# Patient Record
Sex: Male | Born: 1950 | Race: White | Hispanic: No | Marital: Single
Health system: Midwestern US, Academic
[De-identification: ages and names within clinical notes are randomized; demographics above are authoritative.]

---

## 2006-03-29 NOTE — Unmapped (Signed)
Signed by   LinkLogic on 03/30/2006 at 00:27:42  Patient: Bobby Gilbert  Note: All result statuses are Final unless otherwise noted.    Tests: (1)  (MR)    Order Note:                                    THE [facility]     PATIENT NAME:   Bobby Gilbert, Bobby Gilbert                MR #:  [id number]  DATE OF BIRTH:  05/08/1951                         ACCOUNT #:  [id number]  ED PHYSICIAN:   Sidonie Dickens, M.D.           ROOM #:  ED  PRIMARY:        Eulogio Bear, M.D.                NURSING UNIT:  FED  REFERRING:                                         FC:  C  DICTATED BY:    Sidonie Dickens, M.D.           ADMIT DATE:  03/30/2006  VISIT DATE:     03/29/2006                         DISCHARGE DATE:                           EMERGENCY DEPARTMENT ADMISSION NOTE     DATE OF BIRTH:  06/07/2051     CHIEF COMPLAINT:  Chest pain.     HISTORY OF PRESENT ILLNESS:  This is a 55 year old male who has had chest  pain that started about 8:00 p.m. tonight.  He describes it as a pressure  type sensation, it is in the upper part of his abdomen and lower part of his  chest.  He says that after he noticed it, he started sweating and felt very  lightheaded.  His wife tells me he nearly passed out at least three different  times.  He was just sitting down when he noticed his symptoms.  He tried some  Tums and it did not relieve it.  As time progressed, he started experiencing  the pain up in his shoulder and in his left arm and in his back.  He has had  similar symptoms in the past, but it has been years ago since the last  episode.  He has had a stress test but his last one was three years ago.  He  did have some shortness of breath with this.  He has been nauseous, but has  not thrown up.  Nothing seems to make the pain any better or worse.     Nursing notes were reviewed.     REVIEW OF SYSTEMS:  Denies recent fever, denies cough, has had difficulty  breathing with this discomfort.  No leg swelling.  No lower abdominal  pain.  Remainder of pertinent review of systems reviewed and negative.     PAST MEDICAL HISTORY:  1. Acid reflux.     PAST SURGICAL HISTORY:     1. Internal hemorrhoids.     CURRENT MEDICATIONS:     1. Prevacid.     ALLERGIES:  No known drug allergies.     SOCIAL HISTORY:  He does not smoke.  He does occasionally uses alcohol and  actually had two alcoholic beverages this evening prior to experiencing pain.     FAMILY HISTORY:  Positive for heart disease and believes his mother had her  first heart attack at the age of 36.  She died in her mid 19s of an MI.  Paternal history is not known.     IMMUNIZATIONS:  Not relevant.     PHYSICAL EXAMINATION:     VITAL SIGNS:  Temperature 95.9, pulse 98, respiratory 14, blood pressure  131/73, pulse oximetry 100% on room air.  GENERAL:  This is a 55 year old male who appears uncomfortable, sitting up on  the stretcher.  He is awake, alert and his speech is clear.  HEENT:  Head is  atraumatic.  Conjunctiva is not injected.  Sclerae is  nonicteric.  Mouth with moist mucous membranes.  NECK:  Supple.  No obvious JVD.  LUNGS:  Sounds clear to auscultation with symmetric breath sounds.  HEART:  Regular.  I do not appreciate any murmurs or rubs.  ABDOMEN:  Soft, mildly tender in the epigastric area.  No rebounding or  guarding appreciated.  No pulsatile masses or bruits present.  GENITAL AND RECTAL:  Exams deferred.  EXTREMITIES:  Warm.  There is no peripheral edema, no venous cords or calf  tenderness.  SKIN:  Warm and dry at this point in time.  PSYCHIATRIC:  Affect is appropriate.     DIAGNOSTIC RESULTS:  EKG demonstrates sinus rhythm with a rate of 90 beats  per minute without ectopy.  Conduction intervals normal.  Axis is normal.  There is nonspecific ST-T wave abnormality present.  RS prime pattern in lead  III.  No EKGs available for comparison.     Chest x-ray demonstrates clear lung fields, normal cardiac border, and  mediastinum appears normal.  X-ray was interpreted by  me in the absence of a  radiologist, consisting of one view.     Cardiac markers are negative.  CBC normal.  Electrolytes normal.  Coags  normal.  White cell count slightly elevated at 13.9, n eutrophils 82%.  Lipase and liver enzymes currently pending.     ADDITIONAL DATA:  None.     EMERGENCY DEPARTMENT COURSE:  He has received aspirin and sublingual  nitroglycerin.  The nitroglycerin actually made his chest hurt worse.  He was  offered a dose of morphine, which he refused as he was nauseous and vomited  once and then started feeling better.  He did receive a dose of Phenergan and  has been hemodynamically stable thus far.     PROCEDURE:  None.     CRITICAL CARE:  None.     CONSULTATIONS:  I will speak with Dr. Glean Hess.     MEDICAL DECISION MAKING:  This is a 55 year old male who presents with chest  and upper abdominal pain.  His workup thus far is nondiagnostic.  Included in  the differential was the possibility of acute coronary syndrome,  pancreatitis, cholecystitis, pulmonary embolism, aortic dissection,  pneumothorax, esophageal spasm, amongst other diagnostic considerations.  The  patient did have a heavy meal just prior to the symptoms starting.  It is  certainly possible that her  symptoms have a gastrointestinal source, but  acute coronary syndrome was also in the differential.  I think he needs to be  admitted for further evaluation and treatment.  I will discuss the case with  Dr. Glean Hess and recommend hospital admission.     IMPRESSION:     1.  Chest pain.     PLAN:     1.  Admission to monitored floor.                                                          ________________________________________  CAM/lsp                               ____  D:  03/29/2006 23:45                  Sidonie Dickens, M.D.  T:  03/30/2006 00:21  Job #:  [id number]                              EMERGENCY DEPARTMENT ADMISSION NOTE                                        COPY                   PAGE    1 of 1    Note: An exclamation  mark (!) indicates a result that was not dispersed into   the flowsheet.  Document Creation Date: 03/30/2006 12:27 AM  _______________________________________________________________________    (1) Order result status: Final  Collection or observation date-time: 03/29/2006 00:00  Requested date-time:   Receipt date-time:   Reported date-time:   Referring Physician: Eulogio Bear  Ordering Physician:  Reviewed In Hospital [facility])  Specimen Source:   Source: DBS  Filler Order Number: [id number]  Lab site:

## 2006-03-30 NOTE — Unmapped (Signed)
Signed by   LinkLogic on 03/31/2006 at 00:57:42  Patient: Bobby Gilbert  Note: All result statuses are Final unless otherwise noted.    Tests: (1)  (MR)    Order Note:                                    THE [facility]     PATIENT NAME:   Bobby, Gilbert                MR #:  [id number]  DATE OF BIRTH:  [birth year]                         ACCOUNT #:  [id number]  ADMITTING:      Fritzi Mandes, M.D.               ROOM #:  [phone]  ATTENDING:      Fritzi Mandes, M.D.               NURSING UNIT:  [facility]  PRIMARY:        Eulogio Bear, M.D.                Elaina HoopsSalena Saner  REFERRING:                                         ADMIT DATE:  03/30/2006  DICTATED BY:    Deforest Hoyles, M.D.              DISCHARGE DATE:                                  HISTORY & PHYSICAL     CHIEF COMPLAINT:  Chest pain.     HISTORY OF PRESENT ILLNESS:  The patient is a 55 year old gentleman who went  to have dinner at a new restaurant.  He had a couple of drinks and also had  steak.  He came back to his friend's house, was sitting there for an hour,  and then started developing some nausea and bloating sensation and heaviness.  He also felt lightheaded and was almost ready to pass out.  As per the ER  report, the wife mentioned that he did pass out.  He also broke out into a  sweat and then later developed some chest heaviness and pain radiating to the  left side.  He was brought to the emergency room for further evaluation.  He  vomited in the emergency room, which did help his symptoms.     PAST MEDICAL HISTORY:     1.  Gastroesophageal reflux disease.     PAST SURGICAL HISTORY:     1.  Hemorrhoid surgery.     MEDICATIONS:     1.  Prevacid 30 mg daily.     ALLERGIES:  No known drug allergies.     FAMILY HISTORY:  Positive for heart disease in his mother who is deceased.     SOCIAL HISTORY:  The patient does not smoke cigarettes.  He does drink  alcohol occasionally.  He denies any street drug use to me.     REVIEW OF SYSTEMS:  The patient  denies any fever, chills.  He denies any  headache.  He denies any sore throat.  He denies any diarrhea or  constipation.  He denies any abdominal pain.  He denies any swelling in his  feet or legs.  Rest of review of systems is negative.     PHYSICAL EXAMINATION:     VITAL SIGNS:  Temperature is afebrile, pulse 86, blood pressure 108/70.  GENERAL:  The patient is conscious, awake and alert.  He is oriented to time  and place.  Affect is appropriate.  He is not in any respiratory distress.  Normal male physical exam for the hospitalist with following exception.  RECTAL:  Not done.  GU:  Not done.     LABORATORY DATA:  CBC is within normal limits.  Chemistry panel is also  within normal limits.  CPK and troponin are negative.  LFTs are within normal  limits.  Troponin is less than 0.01 and INR is 1.1.     CHEST X-RAY:  Did not show any acute abnormality.     EKG:  Shows sinus rhythm with heart rate of 71.  No acute ST-T wave changes.     ASSESSMENT:     1.  The patient is a 55 year old gentleman with gastroesophageal reflux  disease.  He presents with chest pain, nausea, vomiting and passing out  spells.  He was also diaphoretic.  His symptoms did start after a large meal,  which raises concern for hiatal hernia and/or gallbladder problems.  His LFTs  are within normal limits.     PLAN:     1.  The patient to be admitted to telemetry bed for 23-hour observation.  2.  Chest pain pathway.  3.  Continue Prevacid.  4.  Consider gallbladder ultrasound as an outpatient if symptoms recur.                                                                ________________________________________  MS/am                                 ____  D:  03/30/2006 10:47                  Deforest Hoyles, M.D.  T:  03/30/2006 14:08  Job #:  [id number]  c:   Azell Der, M.D.                                  HISTORY & PHYSICAL                                        COPY                   PAGE    1 of 1    Note: An exclamation mark (!) indicates  a result that was not dispersed into   the flowsheet.  Document Creation Date: 03/31/2006 12:57 AM  _______________________________________________________________________    (1) Order result status: Final  Collection or observation date-time: 03/30/2006 00:00  Requested date-time:   Receipt date-time:   Reported date-time:  Referring Physician: Eulogio Bear  Ordering Physician:  Reviewed In Hospital [facility])  Specimen Source:   Source: DBS  Filler Order Number: [id number]  Lab site:

## 2006-03-31 NOTE — Unmapped (Signed)
Signed by   LinkLogic on 03/31/2006 at 15:33:11  Patient: Bobby Gilbert  Note: All result statuses are Final unless otherwise noted.    Tests: (1)  (MR)    Order Note:                                    THE [facility]     PATIENT NAME:   Bobby Gilbert, Bobby Gilbert                MR #:  [id number]  DATE OF BIRTH:  1951/03/15                         ACCOUNT #:  [id number]  ADMITTING:      Fritzi Mandes, M.D.               ROOM #:  [phone]  ATTENDING:      Fritzi Mandes, M.D.               NURSING UNIT:  F4W  SERVICE:        Internal Medicine                  FC:  C  PRIMARY:        Eulogio Bear, M.D.                ADMIT DATE:  03/30/2006  REFERRING:                                         DISCHARGE DATE:  03/31/2006  DICTATED BY:    Deforest Hoyles, M.D.                                 STAT DISCHARGE SUMMARY     DISCHARGE DIAGNOSES:     1. Chest pain.  2. Gastroesophageal reflux disease.  3. Syncope.     HISTORY OF PRESENT ILLNESS:  Bobby Gilbert is a 55 year old gentleman who is brought  to the emergency room because of chest pain and not feeling well.     HOSPITAL COURSE:  The patient was admitted to telemetry bed.  He was ruled  out for MI by serial cardiac enzymes and EKGs.  He underwent dual-isotope  myocardial perfusion imaging study.  This did not show any reversible  ischemia.  Left ventricular ejection fraction was approximately 72%.  In view  of his persistent nausea and some abdominal discomfort, a CT scan of the  abdomen was carried out with contrast and this did not show any acute  pathology.  It did show simple cysts in the liver.     CONDITION ON DISCHARGE:  The patient is stable hemodynamically.  He is  tolerating his diet well.  He wants to go home.     DISCHARGE INSTRUCTIONS:     1. The patient is to be discharged home.  2. Follow up with Dr. Estill Bamberg in three to five days.  3. If the patient still has symptoms of nausea and vomiting, he may need     further evaluation with a HIDA scan or an EGD.  4.  Prevacid 30 mg daily.  ________________________________________  MS/kc                                 ____  D:  03/31/2006 15:12                  Deforest Hoyles, M.D.  T:  03/31/2006 15:27  Job #:  [id number]  c:   Azell Der, M.D.                                 STAT DISCHARGE SUMMARY                                        COPY                   PAGE    1 of 1    Note: An exclamation mark (!) indicates a result that was not dispersed into   the flowsheet.  Document Creation Date: 03/31/2006 3:33 PM  _______________________________________________________________________    (1) Order result status: Final  Collection or observation date-time: 03/31/2006 00:00  Requested date-time:   Receipt date-time:   Reported date-time:   Referring Physician: Eulogio Bear  Ordering Physician:  Reviewed In Hospital [facility])  Specimen Source:   Source: DBS  Filler Order Number: [id number]  Lab site:

## 2006-11-10 NOTE — Unmapped (Signed)
Signed by   LinkLogic on 11/14/2006 at 07:51:42  Patient: Bobby Gilbert  Note: All result statuses are Final unless otherwise noted.    Tests: (1) DIAG-CHEST PA & LATERAL [phone])    Order NotePricilla Handler Order Number: [id number]    Non-EMR Ordering Provider: Sabino Niemann      Order Note:     *** VERIFIED ***  [facility]  Reason:  chest pain  Dict.Staff: Milana Huntsman [id number]    Verified By: Milana Huntsman   Ver: 11/14/06   7:53 am  Exams:  DIAG-CHEST PA & LATERAL        Dictated 11/11/06 at 11:18    11/10/06  TWO VIEWS OF THE CHEST:    HISTORY:  Chest pain.    PA and lateral views of the chest compared to the prior study of  03/31/06.    FINDINGS:    PA and lateral views demonstrate no sign of any acute process,  pulmonary consolidation or edema.  Heart size is normal.    CONCLUSION:    NORMAL RADIOGRAPHS OF THE CHEST.      Vonna Drafts  **** end of result ****    Note: An exclamation mark (!) indicates a result that was not dispersed into   the flowsheet.  Document Creation Date: 11/14/2006 7:51 AM  _______________________________________________________________________    (1) Order result status: Final  Collection or observation date-time: 11/10/2006 19:12:43  Requested date-time: 11/10/2006 18:26:00  Receipt date-time:   Reported date-time: 11/14/2006 07:53:44  Referring Physician: Trudee Grip  Ordering Physician:  Non-EMR Physician [facility])  Specimen Source:   Source: QRS  Filler Order Number: [id number]  Lab site: [facility]

## 2006-11-10 NOTE — Unmapped (Signed)
Signed by   LinkLogic on 11/16/2006 at 13:29:16  Patient: Bobby Gilbert  Note: All result statuses are Final unless otherwise noted.    Tests: (1)  (MR)    Order Note:                                    THE [facility]     PATIENT NAME:   Bobby Gilbert, Bobby Gilbert                MR #:  [id number]  DATE OF BIRTH:  12-26-1950                         ACCOUNT #:  [id number]  ED PHYSICIAN:   Edrick Kins, M.D.                ROOM #:  PRIMARY:        Eulogio Bear, M.D.                NURSING UNIT:  ED  REFERRING:                                         FC:  C  DICTATED BY:    Edrick Kins, M.D.                ADMIT DATE:  11/10/2006  VISIT DATE:     11/10/2006                         DISCHARGE DATE:                           EMERGENCY DEPARTMENT DISCHARGE NOTE     *** DEMOGRAPHIC REVISION - ED PHYSICIAN - 11/16/06 - RJS ***        CHIEF COMPLAINT:  Epigastric pain radiating into the chest.     HISTORY OF PRESENT ILLNESS:  The patient is a 56 year old who presents to the  emergency room, states that he awakened this morning with epigastric pain,  which radiated up into the chest.  He states it is kind of a squeezing  sensation to burning sensation.  The patient states he has been belching a  lot.  He states it feels worse when he lays back, better when he sits up.  He  denies any radiation at the arms.  He states he feels a little short of  breath because of it.  He has had nausea, no vomiting.  The patient states  that it started primarily in the epigastric area.  The patient states it does  radiate somewhat to his back.  The patient denies any diarrhea, bright blood  per rectum, melena, dysuria, hematuria, frequency.  Denies any lower leg  swelling.  The patient does have a history of reflux.  He states he took some  Tums to see if it would help, as he has had it in the past, but did not quite  help much.  He does report a lot of heartburn.     PAST MEDICAL HISTORY:     1. Acid reflux.  2. He is status post  stress test, which was negative in June.     MEDICATIONS:     1.  Prevacid.     ALLERGIES:  No known drug allergies.     SOCIAL HISTORY:  He does not smoke.  He is married.  Nursing notes reviewed.     FAMILY HISTORY:  Significant for stroke.  He denies any history of heart  disease.     PHYSICAL EXAMINATION:     VITAL SIGNS:  Temperature is 99.1, pulse 84, respirations 14, blood pressure  125/60.  GENERAL:  The patient appears to be in mild distress secondary to pain.  HEENT:  Head without evidence of trauma.  Eyes:  Pupils round, reactive.  Extraocular muscles intact.  ENT:  Oral mucosa is moist.  Oropharynx not  injected.  NECK:  Supple.  There is no adenopathy.  LUNGS:  Clear to auscultation.  COR:  Regular rate and rhythm, no appreciable rub, no murmur, no extra sounds.  ABDOMEN:  Soft, slightly tender in the epigastrium, no guarding, rebound.  No  Murphy's sign.  No CVA tenderness.  EXTREMITIES:  Show 2+ pulses.  No cyanosis.  NEUROLOGIC:  Grossly nonfocal.     LABORATORY DATA:  The patient had a cardiogram showing a sinus-induced  rhythm, a rate of 84 and axis of 16 degrees, PR 0.18, QRS 0.06 and a QT 0.36.  The patient has no acute ST or T-wave abnormalities, J point elevation is  noted in lead V2, otherwise a normal cardiogram.  The patient has a liver  panel showing an amylase is lipase low at 3.  The rest of his LFTs within  normal limits.  The patient had a troponin of less than 0.01.  He had a CPK  at 106 with an MB of 1.4, which is negative for CK-MB.  The patient had a  chemistry profile showing a sodium 140, potassium 3.9, chloride 104,  bicarbonate 25, glucose 107, BUN 19, creatinine of 1, normal calcium at 8.9.  The patient had a chest x-ray, AP lateral, showing slightly increased  perihilar markings but no obvious effusion or infiltrate.  Patient had a CBC  showing hemoglobin of 15.3, hematocrit of 45.2 and white count of 10,200 with  a platelet count of 263,000.  Differential showed 81 segs, 10  bands, 7  lymphs, 1 mono, 1 eosinophil.  All labs, x-rays, cardiograms were interpreted  by myself.  Chest x-ray was an AP lateral.     DISCUSSION AND HOSPITAL COURSE:  The patient presents to emergency room with  what appears to be chest pain all day.  This awakened him earlier this  morning.  He is without any change throughout the day.  It does not come and  go.  It has been constant.  It sounds to be more heartburn.  It is worse when  he lays back, better when he sits up.  It does radiate to the back.  He has  improved with belching.  He has a lot of heartburn.  He has a history of  hiatal hernia along with some GE reflux disease.  His cardiogram is normal.  His cardiac enzymes are negative, despite greater than 12 hours worth of  pain.  The patient has a chest x-ray, which shows no evidence of  pneumothorax, no active disease, no cardiomegaly.  He has no risk factors.  He has had a recent stress test, which was negative.  Overall, my feeling is  this probably represents GE reflux disease with some esophageal spasm.  I did  consider the possibility of gallbladder disease, although, his liver tests  appear to be negative.  I do not see evidence of underlying pneumonia or  pneumothorax or he is not consistent with pulmonary embolus.  During his  course, the patient was initially given a GI cocktail with a great deal of  improvement in symptomatology, but not complete resolution.  The patient  later had some Demerol and Phenergan.  He dropped his pressure, became a  little bit more nauseated, however after a little bit of fluids, pain  resolved and he felt better.  Overall, my feeling is this probably represents  reflux disease.  I do not see any evidence for frank cardiac ischemia.  He  has had pain for a good period of time, which was improved with GI medication  along with pain medication.  There is no evidence for frank cardiac ischemia  on his cardiogram nor is there any bump in his enzymes.  The patient did  have  a little bit of bandemia, but I think that is more demargination.  At this  point in time, I am going to recommend a follow up with his regular doctor.  I cannot completely rule out 100% clean coronaries, although, a stress test  within the last six months, which was negative and his study today, I do not  feel this particular episode was cardiac.  At present, I will recommend that  he follow up with his doctor.  Return if other problems.     DIAGNOSIS:     1. Chest pain, gastrointestinal origin.     PLAN:     1. Recommend continue present medications.  2. Recommend follow up with Dr. Estill Bamberg, call Monday to arrange.  3. Return if symptoms worse.  4. Given Vicodin 5/500, given total of 20, one every six hours as needed.                                                    ________________________________________  JM/tm                                 ____  D:  11/10/2006 21:15                  Edrick Kins, M.D.  T:  11/11/2006 15:05  R:  11/16/2006 13:28 - ths  Job #:  [id number]                              EMERGENCY DEPARTMENT DISCHARGE NOTE                                        COPY                   PAGE    1 of 1    Note: An exclamation mark (!) indicates a result that was not dispersed into   the flowsheet.  Document Creation Date: 11/16/2006 1:29 PM  _______________________________________________________________________    (1) Order result status: Corrected  Collection or observation date-time: 11/10/2006 00:00  Requested date-time:   Receipt date-time:   Reported date-time:   Referring Physician: Eulogio Bear  Ordering Physician:  Reviewed In  Hospital [facility])  Specimen Source:   Source: DBS  Dewain Penning Order Number: [id number]  Lab site:

## 2006-11-19 NOTE — Unmapped (Signed)
Signed by   [employer] on 11/19/2006 at 22:33:45  Patient: Bobby Gilbert  Note: All result statuses are Final unless otherwise noted.    Tests: (1) US-ABDOMEN LIMITED ([id number])    Order NotePricilla Handler Order Number: [id number]    Non-EMR Ordering Provider: Verlin Fester     Order Note:     *** VERIFIED ***  [facility]  Reason:  ABD PAIN  Dict.Staff: Herby Abraham [phone]    Verified By: March Rummage B     Ver: 11/19/06  10:36 pm  Exams:  US-ABDOMEN LIMITED      LIMITED ABDOMINAL ULTRASOUND 11/19/2006:    HISTORY: Right upper quadrant pain, 789.01.    TECHNICAL FACTORS: High-resolution scanning was performed.    FINDINGS: The gallbladder is normal in size. There are no  abnormal internal echodensities or pathologic shadowing to  suggest stones. Gallbladder wall thickness is normal. There is  no biliary dilatation.    Within the anterior right lobe of the liver there is a cyst,  either septated or two contiguous small cysts, with total size  measuring 1.3 cm x 1.8 cm x 1.9 cm which corresponds to a cyst  seen on CT abdomen dated 03/31/2006. Several other small cysts  were seen in the liver on that CT study which were not imaged on  this examination. The pancreas was not well visualized due to  gas blocking. The right kidney is normal in size (11.3 cm x 4.4  cm x 4.3 cm). There is a normal central echo complex and no  evidence for hydronephrosis.    IMPRESSION:    1. NORMAL-APPEARING GALLBLADDER WITH NO EVIDENCE FOR STONES OR  CHOLECYSTITIS.    2. SMALL CYST RIGHT LOBE OF LIVER.    3. NORMAL-APPEARING RIGHT KIDNEY. THERE IS NO EVIDENCE FOR  HYDRONEPHROSIS.    Lutricia Feil  **** end of result ****    Note: An exclamation mark (!) indicates a result that was not dispersed into   the flowsheet.  Document Creation Date: 11/19/2006 10:33 PM  _______________________________________________________________________    (1) Order result status: Final  Collection or observation date-time: 11/19/2006  09:27:59  Requested date-time: 11/19/2006 09:05:00  Receipt date-time:   Reported date-time: 11/19/2006 22:36:04  Referring Physician: Rema Jasmine  Ordering Physician:  Non-EMR Physician [facility])  Specimen Source:   Source: QRS  Filler Order Number: [id number]  Lab site: [facility]

## 2012-09-25 IMAGING — CT CT LUMBAR SPINE WITHOUT CONTRAST
3 series · 14 of 33 positions shown, 17 images · non-contrast
Comparison: none

CT LUMBAR SPINE WITHOUT CONTRAST, 09/25/12:
INDICATION: Low back pain for one year radiating into the left leg, mostly
while sitting.
TECHNIQUE: 3D reconstructions were ordered and volume rendering images of
the skeletal structures, because it helps visualize the pathology,
demonstrate the pathology to the patient, and guides any necessary surgical
intervention.  3D images were created on an independent workstation
including rotational volume rendered reconstructions.  As per the [HOSPITAL] guidelines 3D reconstructions are performed with
concurrent physician's supervision.

[Series 3: l-spine · axial · 0.41mm/px · z∈[+858,+1058]mm · 6 of 327 slices shown, 8 images]
[im 51/327  soft-tissue]
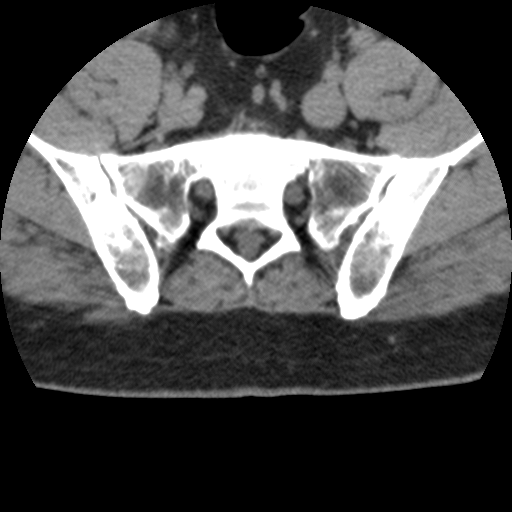
[im 51/327  bone]
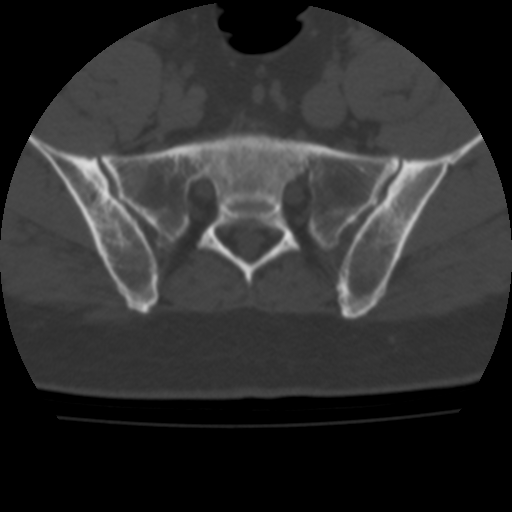
[im 101/327  bone]
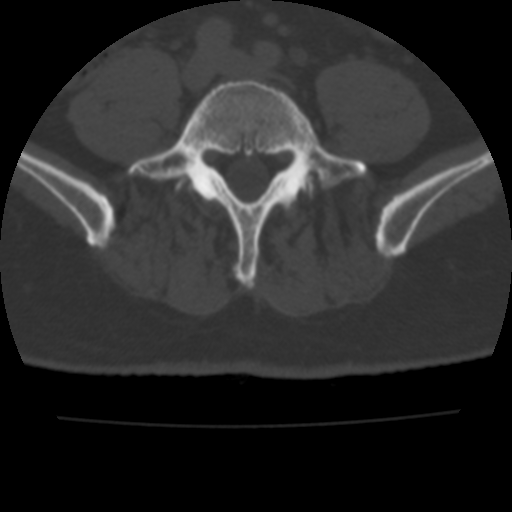
[im 151/327  bone]
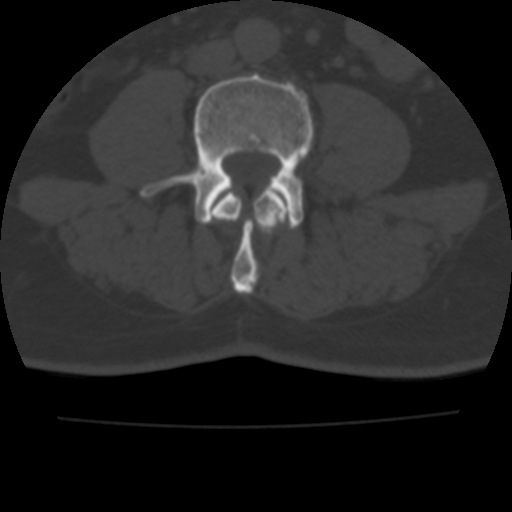
[im 201/327  bone]
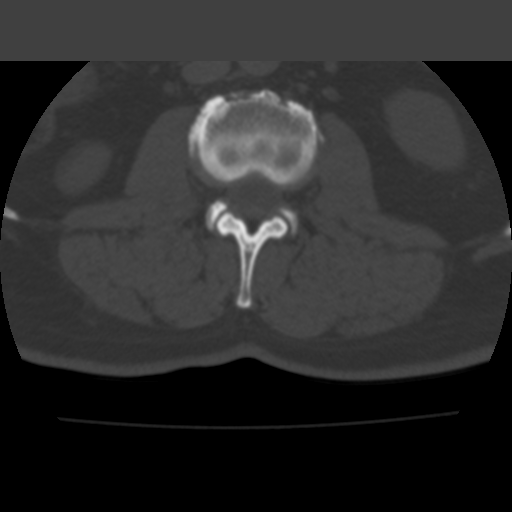
[im 251/327  soft-tissue]
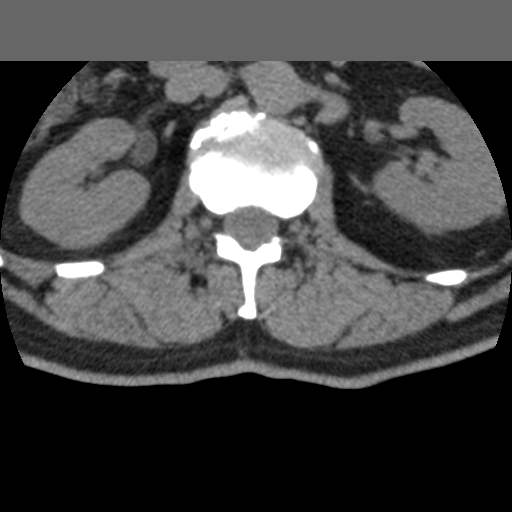
[im 251/327  bone]
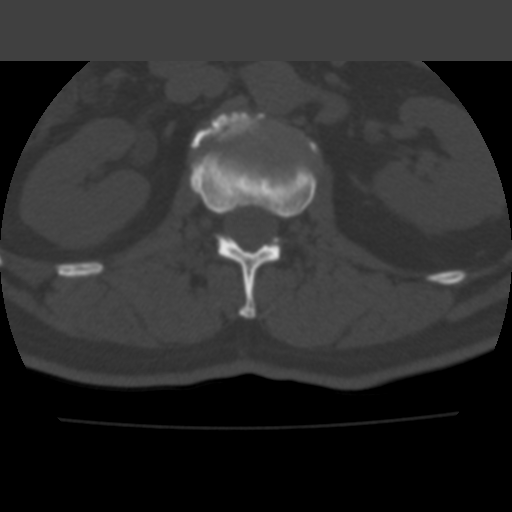
[im 301/327  bone]
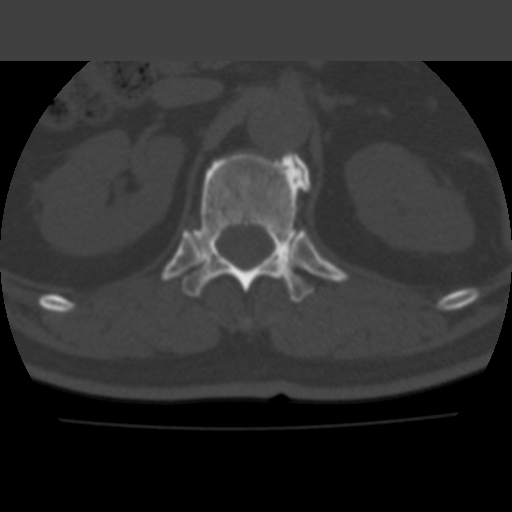

[Series 3: l-spine|coronal.1 · coronal · 0.41mm/px · 3 of 81 slices shown]
[im 17/81  bone]
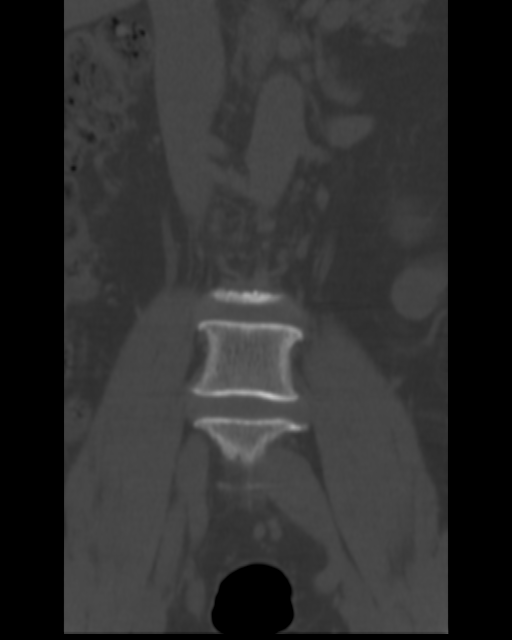
[im 33/81  bone]
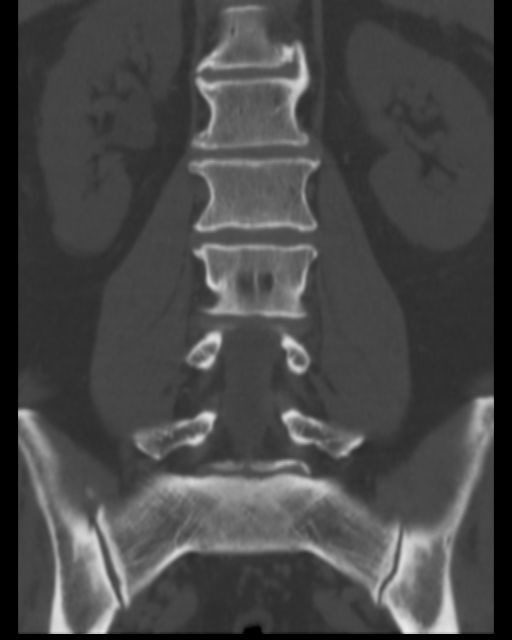
[im 49/81  bone]
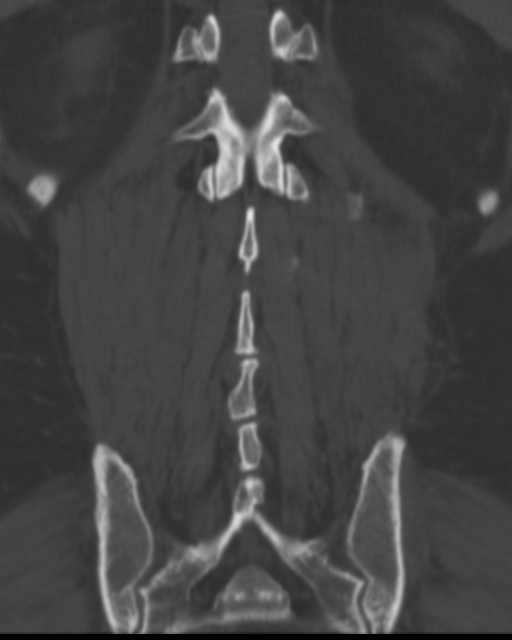

[Series 3: l-spine|sagittal.1 · sagittal · 0.41mm/px · 5 of 71 slices shown, 6 images]
[im 24/71  bone]
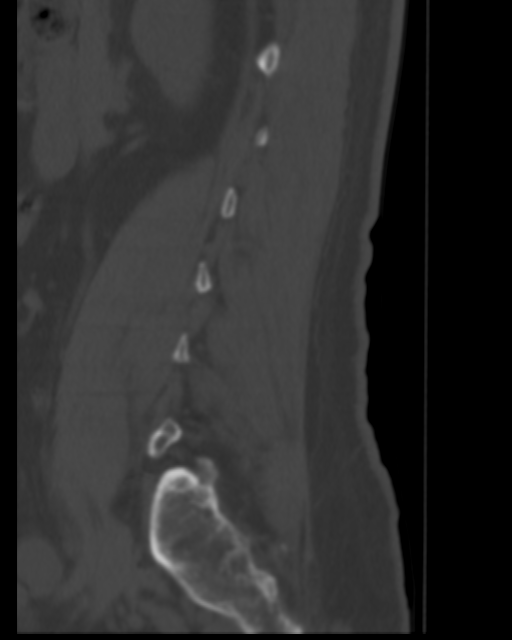
[im 30/71  bone]
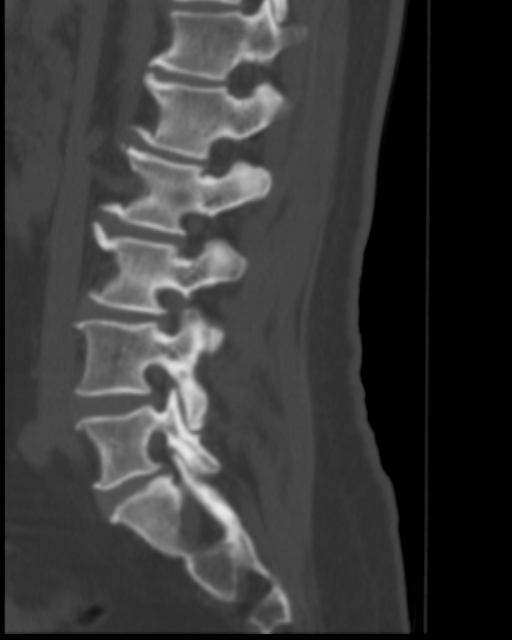
[im 36/71  soft-tissue]
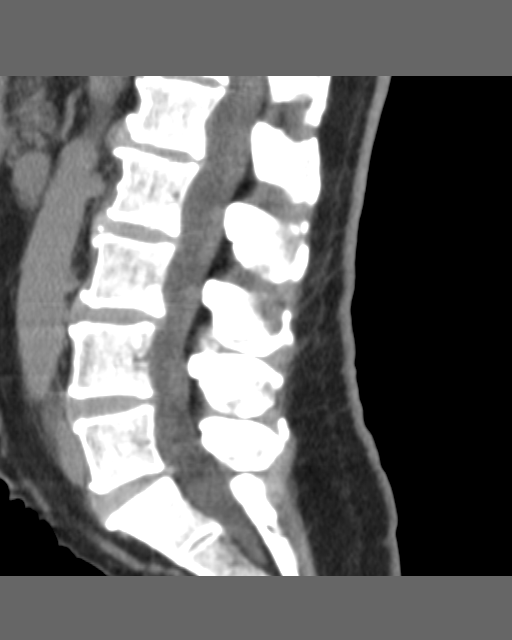
[im 36/71  bone]
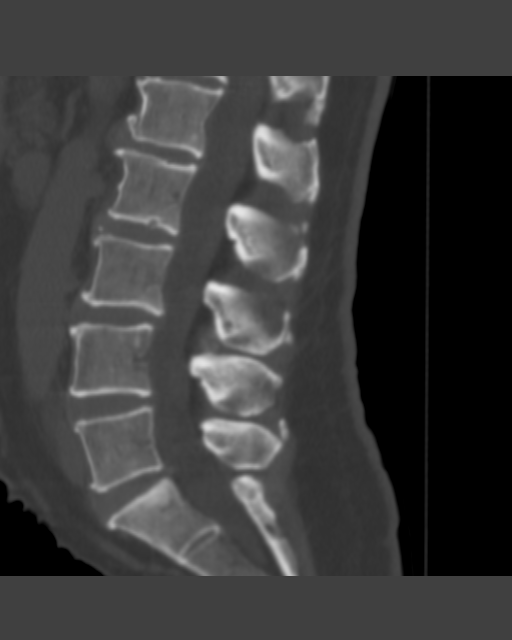
[im 41/71  bone]
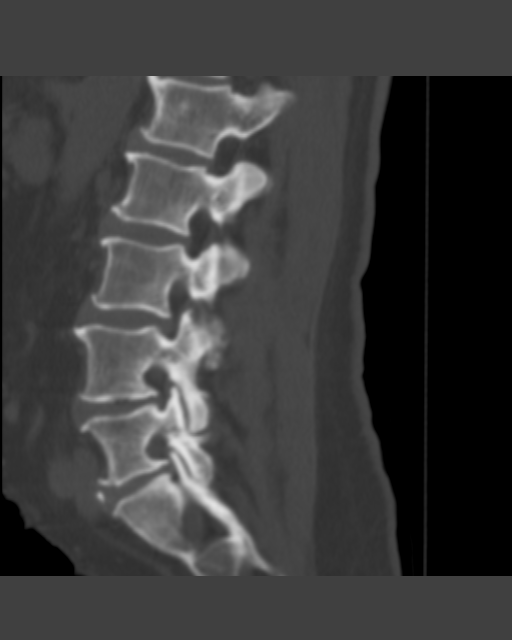
[im 47/71  bone]
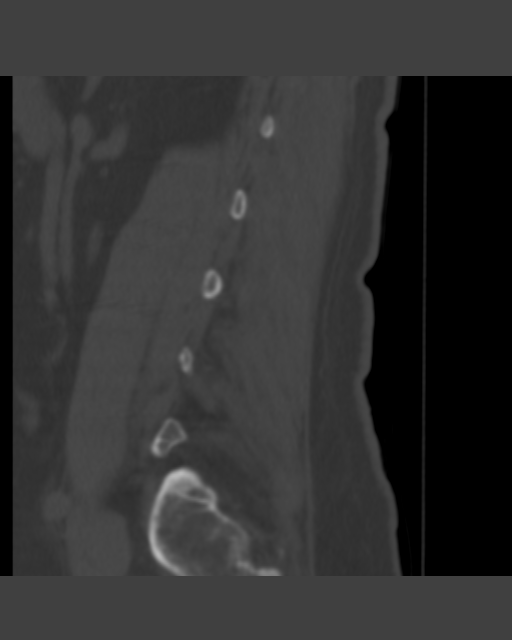

[14 of 33 positions shown; findings below may reference images not displayed]

FINDINGS: There is evidence of disc space narrowing with marginal
osteophytic spurring throughout the lower thoracic and lumbar spine.  The
changes are predominantly appearing anteriorly and laterally, but there is
some posterior disc bulging.  It is most prominent at L3-4 and L5-S1.
Vacuum phenomenon in the disc space at the L5-S1 level is apparent.  I do
not see findings to indicate a disc herniation.  There is evidence of a
prior fracture of the inferior left L3 facet with nonunion.  There are
osteophytes evident extending from the posterior margins of the L4-5 facets
bilaterally, greater on the left than the right.  The neural foramina are
mild to moderately narrowed at L3-4 bilaterally and at L4-5 and L5-S1 on
the left.  The right L3-4 and left L5-S1 neural foramina appear to be most
narrowed.
The SI joints exhibit moderate degenerative changes.  No other notable
findings are apparent.
IMPRESSION: Degenerative disc disease and spondylosis in the lumbar spine as described
above.  No acute abnormalities are revealed.  There are moderate
degenerative changes in the SI joints bilaterally as well.

## 2017-02-20 ENCOUNTER — Inpatient Hospital Stay: Admit: 2017-02-20

## 2017-02-20 NOTE — Unmapped (Signed)
Chief Complaint/Diagnosis:  M54.16 Lumbar Radiculopathy    Onset/Duration:   Yrs    Location of Pain:   Low Back Pain going down front of right leg to foot.  Tingling & Numbness in back & leg  Numbness in left leg as well    Severity of Pain:   10/10    Aggregating Factors:   Sitting, walking, twisting    Effect on daily living:   Yes-can't do anything. Can hardly move    Imaging:  MRI pushed over by [facility]. Report will be scanned    Conservative treatment:  NSAIDS: Ibuprofen  Narcotics: No  Oral Steroids: New prescription today  Physical therapy: Yes-no help    Prior Injection:  July 2017 (w/[place])    Authorization number:  N/A    Patient Prepped for L-ESI

## 2017-02-20 NOTE — Unmapped (Signed)
Addended by: Fleeta Emmer on: 02/21/2017 01:32 PM     Modules accepted: Orders

## 2017-02-21 ENCOUNTER — Inpatient Hospital Stay: Admit: 2017-02-21

## 2017-02-22 NOTE — Unmapped (Signed)
Lumbar Epidural Steroid Injection    Discharge Instructions    You should not drive the day of the procedure.  You may experience leg weakness during the first 24 hours following the procedure.  To prevent yourself from falling, it is important to have someone help you walk.  However, you do not need to stay in bed when you get home.  In fact, it is best to walk around if you feel up to it, but you will need assistance during the first 24 hours following the epidural steroid injection.  Even if you feel better right way, avoid activities that may strain your back.    You may experience side effects from the steroid such as:  flushing, night sweats, nervousness, sleeplessness, increased appetite, irritability, elevated blood glucose.  These symptoms should only last about 2 days.     Keep in mind, that most patients feel increased pain for the first 24 hours.  You should start feeling some pain relief 2-3 days following the injection.  This is because the steroid will start working within three days of the injection with maximal effect by one week.  At that time, we will evaluate your pain level to determine the need for another steroid injection.    Call right away if you notice any of the following symptoms:    ?? Severe pain or headache  ?? Fever or chills  ?? Redness or swelling around the injection site  ?? Loss of bladder or bowel control      Please call Shelly at [phone] or Karen [phone] during business hours, 8AM-4PM, in one week to report your progress, or sooner if you have any questions, or if any problems occur.  Call [phone] after 4PM, if you should have any problems after normal business hours.

## 2017-02-23 ENCOUNTER — Encounter

## 2017-02-23 ENCOUNTER — Inpatient Hospital Stay: Admit: 2017-02-23 | Payer: MEDICARE

## 2017-02-23 DIAGNOSIS — M5416 Radiculopathy, lumbar region: Secondary | ICD-10-CM

## 2017-02-23 MED ORDER — OMNIPAQUE (iohexol) 180 mg iodine/mL Soln 20 mL
180 | Freq: Once | INTRATHECAL | Status: AC | PRN
Start: 2017-02-23 — End: 2017-02-23
  Administered 2017-02-23: 18:00:00 20 mL

## 2017-02-23 MED ORDER — sodium chloride 0.9%
INTRAMUSCULAR | Status: AC
Start: 2017-02-23 — End: 2017-02-24

## 2017-02-23 MED ORDER — bupivacaine (PF) (MARCAINE) 0.25 % (2.5 mg/mL) injection Soln
0.25 | INTRAMUSCULAR | Status: AC
Start: 2017-02-23 — End: 2017-02-23
  Administered 2017-02-23: 18:00:00 25

## 2017-02-23 MED ORDER — triamcinolone acetonide (KENALOG-40) 40 mg/mL injection
40 | INTRAMUSCULAR | Status: AC
Start: 2017-02-23 — End: 2017-02-23
  Administered 2017-02-23: 18:00:00 40

## 2017-02-23 MED FILL — BUPIVACAINE (PF) 0.25 % (2.5 MG/ML) INJECTION SOLUTION: 0.25 0.25 % (2.5 mg/mL) | INTRAMUSCULAR | Qty: 10

## 2017-02-23 MED FILL — KENALOG 40 MG/ML SUSPENSION FOR INJECTION: 40 40 mg/mL | INTRAMUSCULAR | Qty: 2

## 2017-02-23 MED FILL — SODIUM CHLORIDE 0.9 % INJECTION SOLUTION: INTRAMUSCULAR | Qty: 10

## 2017-02-23 NOTE — Unmapped (Signed)
Interventional Radiology Consult Note  Date: 02/23/2017  Patient: Bobby Gilbert  MRN: [id number]      Pertinent Patient Information:  No past medical history on file.  Past Surgical History:   Procedure Laterality Date   ??? BACK SURGERY      Cervical    ??? SHOULDER SURGERY Right      Allergies   Allergen Reactions   ??? Demerol [Meperidine]      Blood Pressure drops     Current Outpatient Prescriptions on File Prior to Encounter   Medication Sig Dispense Refill   ??? gabapentin (NEURONTIN) 300 MG capsule Take 300 mg by mouth 3 times a day.     ??? ibuprofen 200 mg Cap Take by mouth.     ??? omeprazole (PRILOSEC) 20 MG capsule Take 20 mg by mouth every morning before breakfast.     ??? predniSONE (DELTASONE) 20 MG tablet Take 20 mg by mouth daily.       No current facility-administered medications on file prior to encounter.        No results found for: WBC, HGB, HCT, MCV, PLT  No results found for: INR, PROTIME  No results found for: CREATININE  No results found for: ALT, AST, GGT, ALKPHOS, BILITOT      Chief Complaint/Diagnosis:  M54.16 Lumbar Radiculopathy  ??  Onset/Duration:   Yrs  ??  Location of Pain:   Low Back Pain going down front of right leg to foot.  Tingling & Numbness in back & leg  Numbness in left leg as well lateral thigh  ??  Severity of Pain:   10/10  ??  Aggregating Factors:   Sitting, walking, twisting  ??  Effect on daily living:   Yes-can't do anything. Can hardly move  ??  Imaging:  MRI pushed over by [facility]. Report will be scanned  ??  Conservative treatment:  NSAIDS: Ibuprofen  Narcotics: No  Oral Steroids: New prescription today  Physical therapy: Yes-no help  ??  Prior Injection:  July 2017 (w/[place])  ??  Authorization number:  N/A  ??      Focused Physical Exam:  There were no vitals taken for this visit.  Constitutional: no acute distress appears stated age, normal affect.   Palpable tenderness: no  Weakness: no    Planned Procedure:  Right L3 TF ESI and Left L5 TF ESI      Please call with  questions.    Thank you,    J. Laurita Quint, M.D.

## 2017-02-23 NOTE — Unmapped (Signed)
Neuroradiology Spine Post Procedure Note  Date: 02/23/2017  Patient: Bobby Gilbert  MRN: [id number]    Procedure:   Lumbar (Right L3 and Left L5) Transforaminal Epidural steroid injections    Operators:   Dr. Dolores Hoose    Findings:   Contrast opacification of the ventral epidural space    Medications:  3ml Lidocaine (1%) Subcutaneous  1.56mL Bupivicaine (0.25%) Epidural  1mL Kenalog (40mg /mL) Epidural    3ml Lidocaine (1%) Subcutaneous  1.46mL Bupivicaine (0.25%) Epidural  1mL Kenalog (40mg /mL) Epidural    Estimated Blood Loss:  Minimal (Less Than 5 mL)     Complications:  None.    Recommendations/Followup:  Followup: The patient will be contacted in a few days by our assistant/scheduler, [place], who can be reached at [phone] if needed.    -Please refer to full dictated Radiology report for details of findings/procedure.

## 2017-02-23 NOTE — Unmapped (Signed)
Reviewed handout for pre and post epidural steroid injection with [facility] Set[facility].  Questions answered.  Verbalizes understanding.

## 2017-03-05 NOTE — Telephone Encounter (Signed)
Follow-up from Lumbar ESI at [facility]  Still hurting.  Still can't do anything.   Helped a little-maybe 20%  Is trying to get in contact w/[facility]

## 2024-02-13 IMAGING — MR MRI CERVICAL SPINE WITHOUT CONTRAST
7 of 12 series · 10 of 48 positions shown · IV contrast (gadolinium)
Comparison: None

________________________________________________________________________________________________ 
MRI CERVICAL SPINE WITHOUT CONTRAST, 02/13/2024 [DATE]: 
CLINICAL INDICATION: Cervicalgia , chronic neck pain radiates to both arms. 
History of previous cervical spine fusion.
TECHNIQUE: Multiplanar, multiecho position MR images of the cervical spine were 
performed without intravenous gadolinium enhancement.

[Series 101: survey · axial · 10.0mm · 1.25mm/px · 1 of 10 slices shown]
[im 1/10]
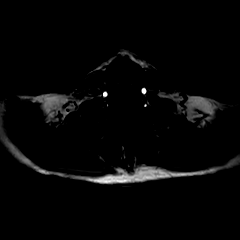

[Series 201: t2w_cor-surv · coronal · 5.0mm · 0.69mm/px · 1 of 6 slices shown]
[im 1/6]
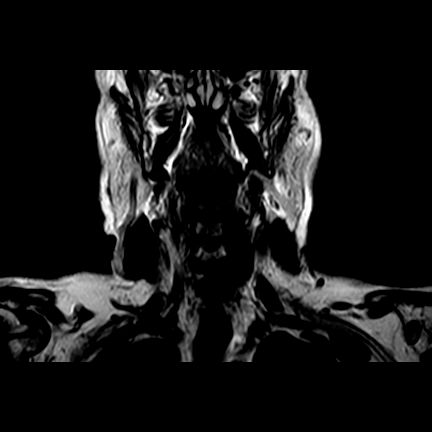

[Series 301: T1 · sagittal · 3.0mm · 0.39mm/px · 1 of 15 slices shown]
[im 1/15]
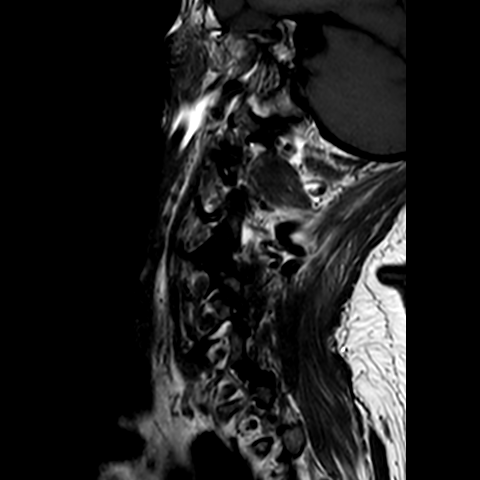

[Series 402: (id)_mdixon_tse · sagittal · 3.0mm · 0.35mm/px · 1 of 15 slices shown]
[im 1/15]
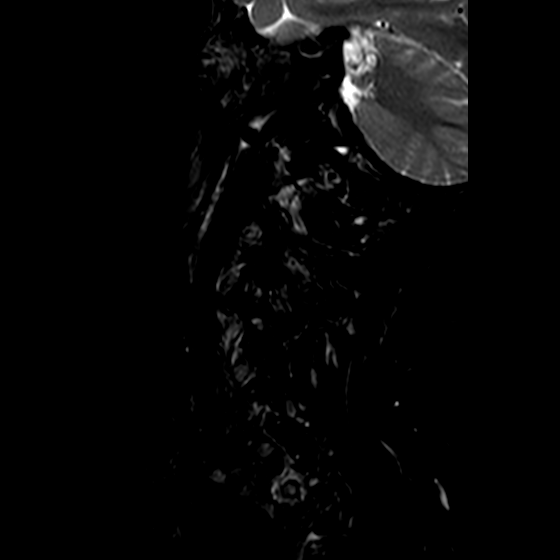

[Series 403: st2w_mdixon_tse · sagittal · 3.0mm · 0.35mm/px · 1 of 15 slices shown]
[im 1/15]
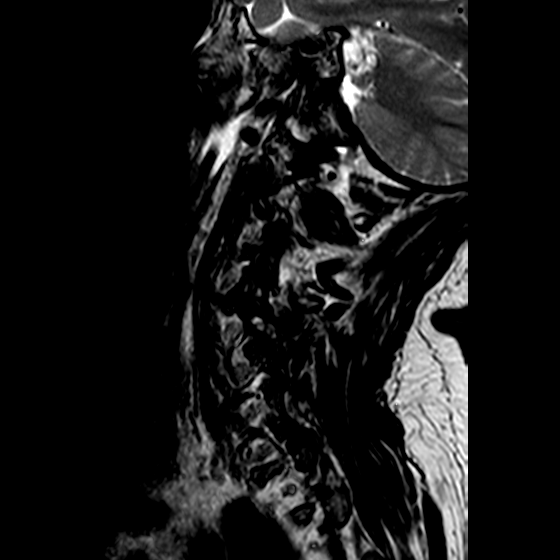

[Series 504: csp left oblq · oblique · 1.0mm · 0.15mm/px · 3 of 29 slices shown]
[im 1/29]
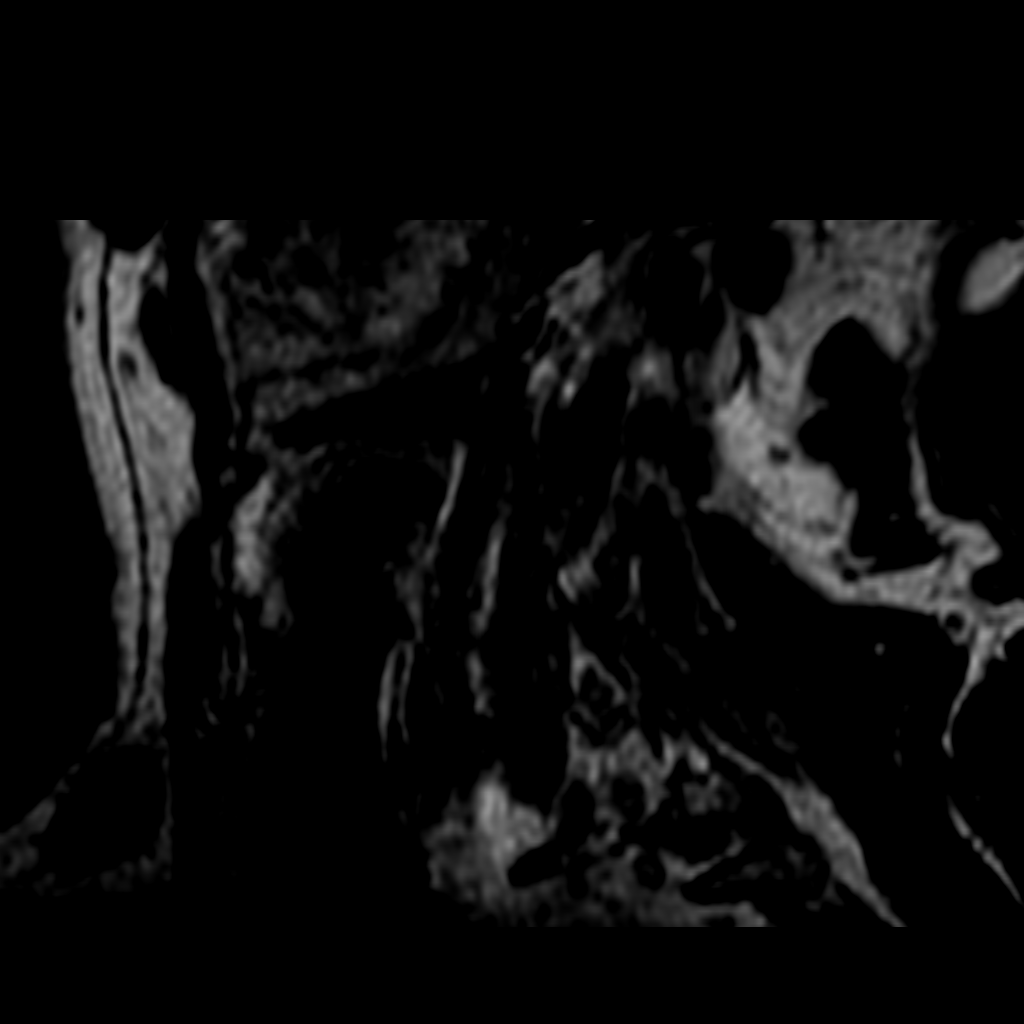
[im 15/29]
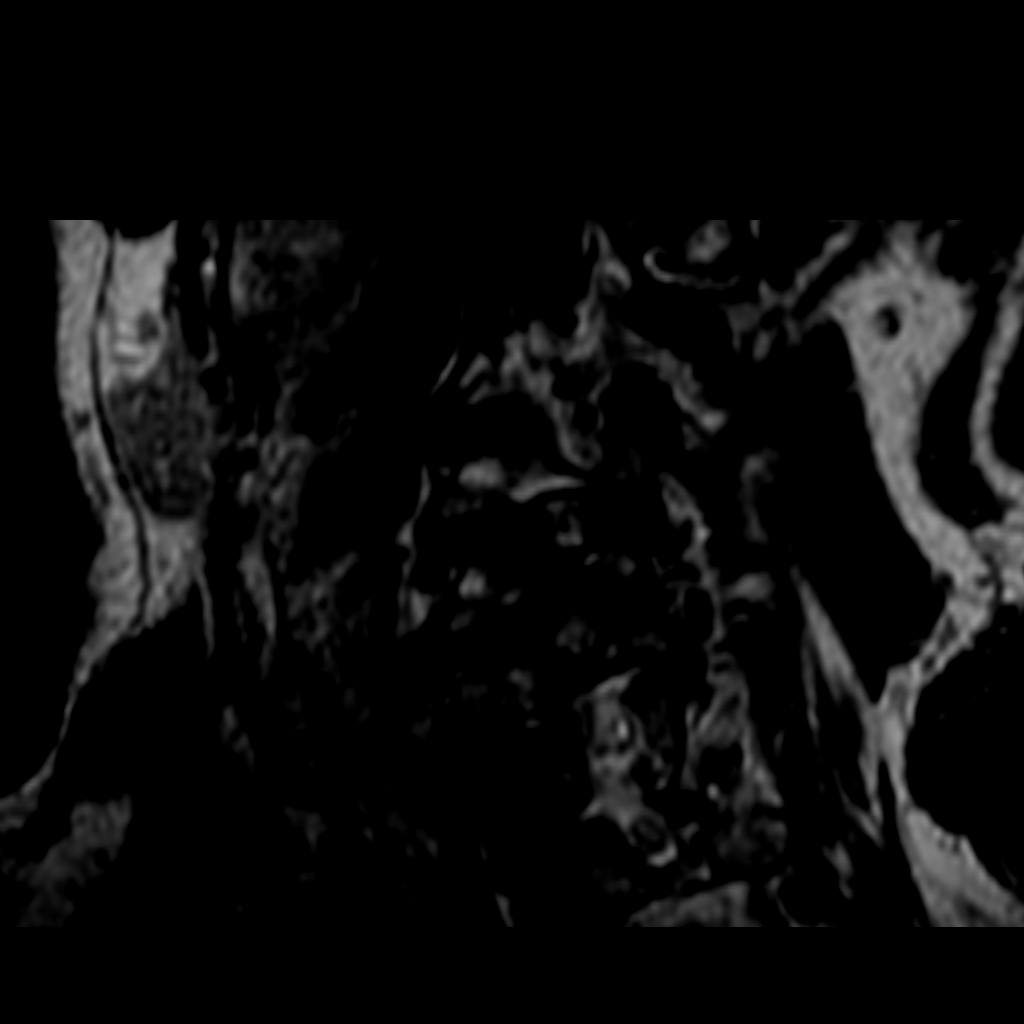
[im 29/29]
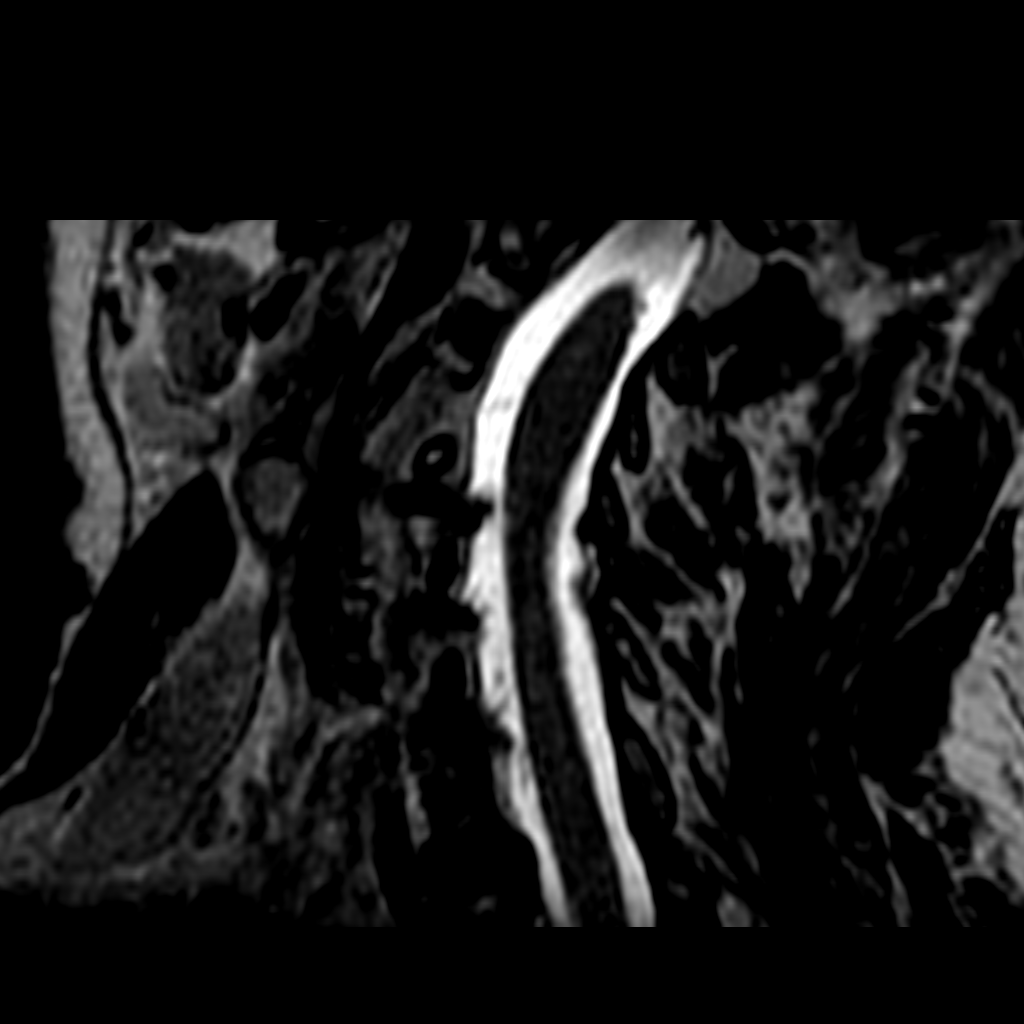

[Series 601: T2 · oblique · 3.0mm · 0.29mm/px · 2 of 18 slices shown]
[im 1/18]
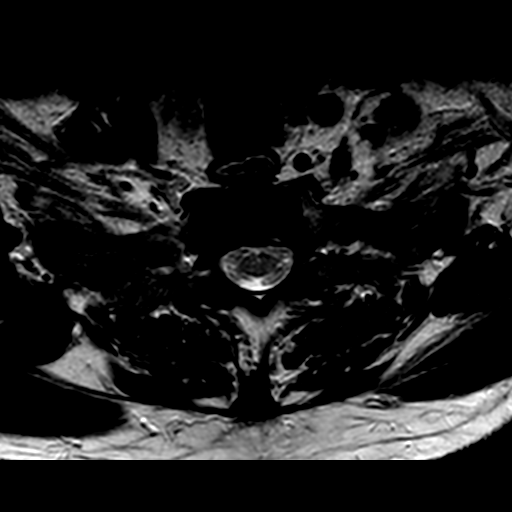
[im 18/18]
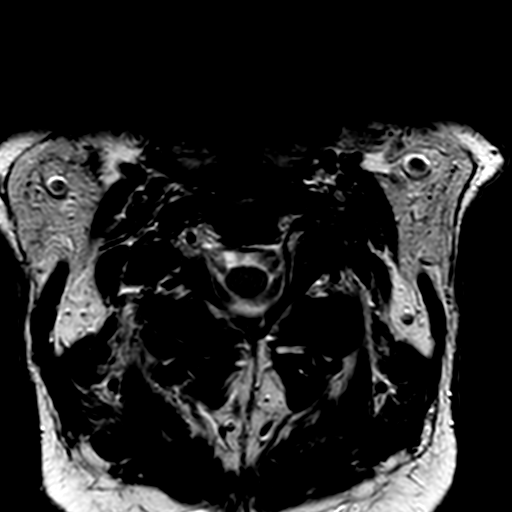

[10 of 48 positions shown; findings below may reference images not displayed]

FINDINGS: -------------------------------------------------------------------------------- 
----------------- 
GENERAL: 
ALIGNMENT: Normal coronal alignment. Normal sagittal alignment. ACDF C6-C7. 
VERTEBRAL BODY HEIGHT: Normal.  
MARROW SIGNAL: No focal suspect signal abnormality. 
CORD SIGNAL: Normal.  
ADDITIONAL FINDINGS: None. 
-------------------------------------------------------------------------------- 
---------------- 
SEGMENTAL: 
CRANIOCERVICAL JUNCTION: No significant stenosis. 
C2-C3: Patent canal and right foramen. Left foramen is mildly narrowed by 
left-sided facet arthropathy. Small central disc osteophyte protrusion. 
C3-C4: Normal disc height. Canal and right foramen are patent. Borderline left 
foraminal narrowing. Left-sided facet arthropathy. 
C4-C5: Normal disc height. Disc osteophyte abuts and indents the ventral cord 
margin with mild canal stenosis and is most prominent just to the right of 
midline. Bilateral uncinate spurring and facet arthropathy with mild to moderate 
bilateral foraminal narrowing. 
C5-C6: Slight loss of disc height. Disc osteophyte abuts and flattens the 
ventral cord margin with mild canal stenosis. Bilateral uncinate spurring with 
moderate right and mild left foraminal narrowing. Facet arthropathy bilaterally. 
C6-C7: Fusion. Posterior osteophytic ridging abuts and flattens the ventral cord 
margin with mild canal stenosis. Foramina patent. 
C7-T1: Normal disc height. Canal and foramina are patent. Facet arthropathy. 
-------------------------------------------------------------------------------- 
---------------
IMPRESSION: C6-C7 ACDF. 
Multilevel cervical spondylosis but without significant canal stenosis. There is 
ventral cord distortion C4-C5 through C6-C7 with foraminal narrowing as detailed 
above.
